# Patient Record
Sex: Female | Born: 1950 | Race: White | Hispanic: No | Marital: Married | Smoking: Never smoker
Health system: Southern US, Community
[De-identification: ages and names within clinical notes are randomized; demographics above are authoritative.]

## PROBLEM LIST (undated history)

## (undated) DIAGNOSIS — E785 Hyperlipidemia, unspecified: Secondary | ICD-10-CM

---

## 2019-03-31 ENCOUNTER — Ambulatory Visit (INDEPENDENT_AMBULATORY_CARE_PROVIDER_SITE_OTHER): Payer: Medicare Other

## 2019-03-31 ENCOUNTER — Other Ambulatory Visit: Payer: Self-pay

## 2019-03-31 ENCOUNTER — Encounter (HOSPITAL_COMMUNITY): Payer: Self-pay

## 2019-03-31 ENCOUNTER — Ambulatory Visit (HOSPITAL_COMMUNITY)
Admission: EM | Admit: 2019-03-31 | Discharge: 2019-03-31 | Disposition: A | Discharge status: Home / Self Care | Payer: Medicare Other | Attending: Family Medicine | Admitting: Family Medicine

## 2019-03-31 DIAGNOSIS — M5136 Other intervertebral disc degeneration, lumbar region: Secondary | ICD-10-CM

## 2019-03-31 DIAGNOSIS — M545 Low back pain, unspecified: Secondary | ICD-10-CM

## 2019-03-31 HISTORY — DX: Hyperlipidemia, unspecified: E78.5

## 2019-03-31 MED ORDER — METHYLPREDNISOLONE 4 MG PO TBPK: ORAL_TABLET | ORAL | 0 refills | Status: AC

## 2019-03-31 MED ORDER — HYDROCODONE-ACETAMINOPHEN 5-325 MG PO TABS: 1.0000 | ORAL_TABLET | Freq: Four times a day (QID) | ORAL | 0 refills | Status: DC | PRN

## 2019-03-31 MED ORDER — METHYLPREDNISOLONE 4 MG PO TBPK: ORAL_TABLET | ORAL | 0 refills | Status: DC

## 2019-03-31 MED ORDER — HYDROCODONE-ACETAMINOPHEN 5-325 MG PO TABS: 1.0000 | ORAL_TABLET | Freq: Four times a day (QID) | ORAL | 0 refills | Status: AC | PRN

## 2019-03-31 NOTE — Discharge Instructions (Addendum)
Activity as tolerated Ice or heat to back Take the Medrol pack (steroid) as directed.  Take all of day 1 today Take pain medicine as needed Return if worse at any time instead of better, or if you fail to improve over the next few days

## 2019-03-31 NOTE — ED Triage Notes (Signed)
Pt presents to UC w/ c/o injury to back by lifting grandchildren. Pt has c/o mid lower back pain for 2-3 days. Pt has difficulty getting up from chair.  Pt states pain is worse when she sits.

## 2019-03-31 NOTE — ED Provider Notes (Signed)
[facility]    CSN: [id number] Arrival date & time: 03/31/19  0805      History   Chief Complaint Chief Complaint  Patient presents with  . Back Pain    HPI Anna Delgado is a 68 y.o. female.   HPI  Patient states that she has low back pain.  In the central low back.  Its a little bit more towards the left side.  No injury or fall.  No known back condition.  No known osteoporosis or bone disease.  Patient states that it is been present for 2 to 3 days.  Getting worse.  It hurts when she sits and when she moves.  She has tried Advil with no improvement.  No radiation of pain to the legs.  No numbness or weakness. No urinary symptoms or recent fever.  No history of cancer Patient states that her daughter is a Publishing rights manager. she is here from [place] visiting her daughter and family.  She has been doing more bending and lifting of small grandchildren.  Her daughter has suggested that she needs an x-ray  Past Medical History:  Diagnosis Date  . Hyperlipidemia     There are no active problems to display for this patient.   History reviewed. No pertinent surgical history.  OB History   No obstetric history on file.      Home Medications    Prior to Admission medications   Medication Sig Start Date End Date Taking? Authorizing Provider  rosuvastatin (CRESTOR) 10 MG tablet Take 10 mg by mouth daily.   Yes [provider]  HYDROcodone-acetaminophen (NORCO/VICODIN) 5-325 MG tablet Take 1-2 tablets by mouth every 6 (six) hours as needed. 03/31/19   Eustace Moore, MD  methylPREDNISolone (MEDROL DOSEPAK) 4 MG TBPK tablet tad 03/31/19   Eustace Moore, MD    Family History Family History  Problem Relation Age of Onset  . Healthy Mother   . Healthy Father     Social History Social History   Tobacco Use  . Smoking status: Never Smoker  . Smokeless tobacco: Never Used  Substance Use Topics  . Alcohol use: Not on file  . Drug use: Not  on file     Allergies   Patient has no known allergies.   Review of Systems Review of Systems  Constitutional: Negative for chills and fever.  HENT: Negative for ear pain and sore throat.   Eyes: Negative for pain and visual disturbance.  Respiratory: Negative for cough and shortness of breath.   Cardiovascular: Negative for chest pain and palpitations.  Gastrointestinal: Negative for abdominal pain and vomiting.  Genitourinary: Negative for dysuria and hematuria.  Musculoskeletal: Positive for back pain. Negative for arthralgias.  Skin: Negative for color change and rash.  Neurological: Negative for seizures and syncope.  All other systems reviewed and are negative.    Physical Exam Triage Vital Signs ED Triage Vitals  Enc Vitals Group     BP 03/31/19 0827 (!) 156/89     Pulse Rate 03/31/19 0827 76     Resp 03/31/19 0827 16     Temp 03/31/19 0827 98.4 F (36.9 C)     Temp Source 03/31/19 0827 Oral     SpO2 03/31/19 0827 96 %     Weight --      Height --      Head Circumference --      Peak Flow --      Pain Score 03/31/19 0831 5  Pain Loc --      Pain Edu? --      Excl. in GC? --    No data found.  Updated Vital Signs BP (!) 156/89 (BP Location: Left Arm)   Pulse 76   Temp 98.4 F (36.9 C) (Oral)   Resp 16   SpO2 96%      Physical Exam Constitutional:      General: She is not in acute distress.    Appearance: She is well-developed and normal weight.     Comments: Appears uncomfortable.  Guarded movements  HENT:     Head: Normocephalic and atraumatic.     Mouth/Throat:     Comments: Mask in place Eyes:     Conjunctiva/sclera: Conjunctivae normal.     Pupils: Pupils are equal, round, and reactive to light.  Neck:     Musculoskeletal: Normal range of motion.  Cardiovascular:     Rate and Rhythm: Normal rate.  Pulmonary:     Effort: Pulmonary effort is normal. No respiratory distress.  Abdominal:     General: There is no distension.      Palpations: Abdomen is soft.  Musculoskeletal: Normal range of motion.     Comments: Back is straight and symmetric.  No tenderness.  No palpable muscle spasm.  Patient can forward flex to fingertips just above the floor.  Normal extension and lateral movement.  Strength sensation range of motion reflexes normal in both lower extremities.  Straight leg raise causes increased back pain with no radiculopathy.  Skin:    General: Skin is warm and dry.  Neurological:     General: No focal deficit present.     Mental Status: She is alert.     Coordination: Coordination normal.     Deep Tendon Reflexes: Reflexes normal.  Psychiatric:        Mood and Affect: Mood normal.        Behavior: Behavior normal.    Explained patient with nontraumatic back pain, x-ray is rarely indicated.  Her daughter, who is a medical provider, has convinced patient that she may have a bony injury.  X-rays performed.  UC Treatments / Results  Labs (all labs ordered are listed, but only abnormal results are displayed) Labs Reviewed - No data to display  EKG   Radiology Dg Lumbar Spine Complete  Result Date: 03/31/2019 CLINICAL DATA:  Low back pain for the past 3 days, potentially from lifting injury. EXAM: LUMBAR SPINE - COMPLETE 4+ VIEW COMPARISON:  None. FINDINGS: There are 6 non rib-bearing lumbar type vertebral bodies. For the purposes of this dictation, lumbar levels be labeled L1 through L6. There is a mild scoliotic curvature of the thoracolumbar spine with dominant caudal component convex the left measuring approximately 16 degrees (as measured from the superior endplate of L3 to the inferior endplate of L5). No anterolisthesis or retrolisthesis. No definite pars defects. Lumbar vertebral body heights are preserved. Lumbar intervertebral disc space heights are preserved. Mild to moderate multilevel lumbar spine DDD, worse at L4-L5, and to a lesser extent, L3-L4, L5-L6 and L6-S1 with disc space height loss,  endplate irregularity and sclerosis. Limited visualization of the bilateral SI joints is normal. Phleboliths overlie the lower pelvis bilaterally. Large colonic stool burden without evidence of enteric obstruction. IMPRESSION: 1. Transitional anatomy with spinal labeling as above. 2. Mild-to-moderate multilevel lumbar spine DDD worse at L4-L5. Electronically Signed   By: Simonne ComeJohn  Watts M.D.   On: 03/31/2019 09:34    Procedures Procedures (including critical  care time)  Medications Ordered in UC Medications - No data to display  Initial Impression / Assessment and Plan / UC Course  I have reviewed the triage vital signs and the nursing notes.  Pertinent labs & imaging results that were available during my care of the patient were reviewed by me and considered in my medical decision making (see chart for details).     Patient has pre-existing lumbar degenerative disc disease is most pronounced at the L4-5 level.  This corresponds with the pain.  No evidence of disc herniation or radiculopathy.  Will treat with steroids and pain management.  Activity as tolerated.  Return for any worsening pain or failure to improve Final Clinical Impressions(s) / UC Diagnoses   Final diagnoses:  Acute midline low back pain without sciatica  DDD (degenerative disc disease), lumbar     Discharge Instructions     Activity as tolerated Ice or heat to back Take the Medrol pack (steroid) as directed.  Take all of day 1 today Take pain medicine as needed Return if worse at any time instead of better, or if you fail to improve over the next few days    ED Prescriptions    Medication Sig Dispense Auth. Provider   methylPREDNISolone (MEDROL DOSEPAK) 4 MG TBPK tablet  (Status: Discontinued) tad 21 tablet Raylene Everts, MD   HYDROcodone-acetaminophen (NORCO/VICODIN) 5-325 MG tablet  (Status: Discontinued) Take 1-2 tablets by mouth every 6 (six) hours as needed. 10 tablet Raylene Everts, MD    HYDROcodone-acetaminophen (NORCO/VICODIN) 5-325 MG tablet Take 1-2 tablets by mouth every 6 (six) hours as needed. 10 tablet Raylene Everts, MD   methylPREDNISolone (MEDROL DOSEPAK) 4 MG TBPK tablet tad 21 tablet Raylene Everts, MD     I have reviewed the PDMP during this encounter.   Raylene Everts, MD 03/31/19 [phone]

## 2019-11-25 ENCOUNTER — Telehealth: Payer: Medicare Other | Admitting: Nurse Practitioner

## 2019-11-25 DIAGNOSIS — J0100 Acute maxillary sinusitis, unspecified: Secondary | ICD-10-CM

## 2019-11-25 MED ORDER — AMOXICILLIN-POT CLAVULANATE 875-125 MG PO TABS: 1.0000 | ORAL_TABLET | Freq: Two times a day (BID) | ORAL | 0 refills | Status: AC

## 2019-11-25 NOTE — Progress Notes (Signed)

## 2020-03-25 ENCOUNTER — Encounter (HOSPITAL_COMMUNITY): Payer: Self-pay | Admitting: *Deleted

## 2020-03-25 ENCOUNTER — Other Ambulatory Visit: Payer: Self-pay

## 2020-03-25 ENCOUNTER — Ambulatory Visit (HOSPITAL_COMMUNITY)
Admission: EM | Admit: 2020-03-25 | Discharge: 2020-03-25 | Disposition: A | Discharge status: Home / Self Care | Payer: Medicare Other | Attending: Emergency Medicine | Admitting: Emergency Medicine

## 2020-03-25 DIAGNOSIS — J029 Acute pharyngitis, unspecified: Secondary | ICD-10-CM | POA: Insufficient documentation

## 2020-03-25 NOTE — ED Triage Notes (Signed)
Pt reports Sore throat .

## 2020-03-25 NOTE — ED Notes (Signed)
PT left [facility] to go to [facility] to pick up  meds for other family

## 2020-03-28 LAB — CULTURE, GROUP A STREP (THRC)

## 2020-03-30 NOTE — ED Provider Notes (Signed)
[facility]    CSN: [id number] Arrival date & time: 03/25/20  1826      History   Chief Complaint Chief Complaint  Patient presents with  . Sore Throat    HPI Anna Delgado is a 69 y.o. female.   Pt here with husband. Her grand daughter that they have been watching was dx with steph. They would like a test. States she feels like her throat is scratchy. Has not taken anything pta.      Past Medical History:  Diagnosis Date  . Hyperlipidemia     There are no problems to display for this patient.   History reviewed. No pertinent surgical history.  OB History   No obstetric history on file.      Home Medications    Prior to Admission medications   Medication Sig Start Date End Date Taking? Authorizing Provider  amoxicillin-clavulanate (AUGMENTIN) 875-125 MG tablet Take 1 tablet by mouth 2 (two) times daily. 11/25/19   Daphine Deutscher Mary-Margaret, FNP  HYDROcodone-acetaminophen (NORCO/VICODIN) 5-325 MG tablet Take 1-2 tablets by mouth every 6 (six) hours as needed. 03/31/19   Eustace Moore, MD  methylPREDNISolone (MEDROL DOSEPAK) 4 MG TBPK tablet tad 03/31/19   Eustace Moore, MD  rosuvastatin (CRESTOR) 10 MG tablet Take 10 mg by mouth daily.    [provider]    Family History Family History  Problem Relation Age of Onset  . Healthy Mother   . Healthy Father     Social History Social History   Tobacco Use  . Smoking status: Never Smoker  . Smokeless tobacco: Never Used  Substance Use Topics  . Alcohol use: Not on file  . Drug use: Not on file     Allergies   Patient has no known allergies.   Review of Systems Review of Systems  Constitutional: Negative.   HENT: Positive for sore throat.   Respiratory: Negative.   Gastrointestinal: Negative.   Genitourinary: Negative.   Skin: Negative.   Neurological: Negative.      Physical Exam Triage Vital Signs ED Triage Vitals  Enc Vitals Group     BP 03/25/20 1946 (!)  157/92     Pulse Rate 03/25/20 1946 83     Resp 03/25/20 1946 18     Temp 03/25/20 1946 98.5 F (36.9 C)     Temp Source 03/25/20 1946 Oral     SpO2 03/25/20 1946 94 %     Weight 03/25/20 1912 180 lb (81.6 kg)     Height 03/25/20 1912 5\' 11"  (1.803 m)     Head Circumference --      Peak Flow --      Pain Score 03/25/20 1949 1     Pain Loc --      Pain Edu? --      Excl. in GC? --    No data found.  Updated Vital Signs BP (!) 157/92 (BP Location: Right Arm)   Pulse 83   Temp 98.5 F (36.9 C) (Oral)   Resp 18   Ht 5\' 9"  (1.753 m)   Wt 180 lb (81.6 kg)   SpO2 94%   BMI 26.58 kg/m   Visual Acuity      Physical Exam   UC Treatments / Results  Labs (all labs ordered are listed, but only abnormal results are displayed) Labs Reviewed  CULTURE, GROUP A STREP [facility])    EKG   Radiology No results found.  Procedures Procedures (including critical care time)  Medications Ordered in UC Medications - No data to display  Initial Impression / Assessment and Plan / UC Course  I have reviewed the triage vital signs and the nursing notes.  Pertinent labs & imaging results that were available during my care of the patient were reviewed by me and considered in my medical decision making (see chart for details).    Pt will need to look in my chart for results or test Take motrin as needed  Final Clinical Impressions(s) / UC Diagnoses   Final diagnoses:  Pharyngitis, unspecified etiology   Discharge Instructions   None    ED Prescriptions    None     PDMP not reviewed this encounter.   Coralyn Mark, NP 03/30/20 1142

## 2021-04-16 IMAGING — DX DG LUMBAR SPINE COMPLETE 4+V
5 series · 5 of 5 positions shown · non-contrast
Comparison: None.

CLINICAL DATA: Low back pain for the past 3 days, potentially from
lifting injury.

EXAM:
LUMBAR SPINE - COMPLETE 4+ VIEW

[l-spine ap]
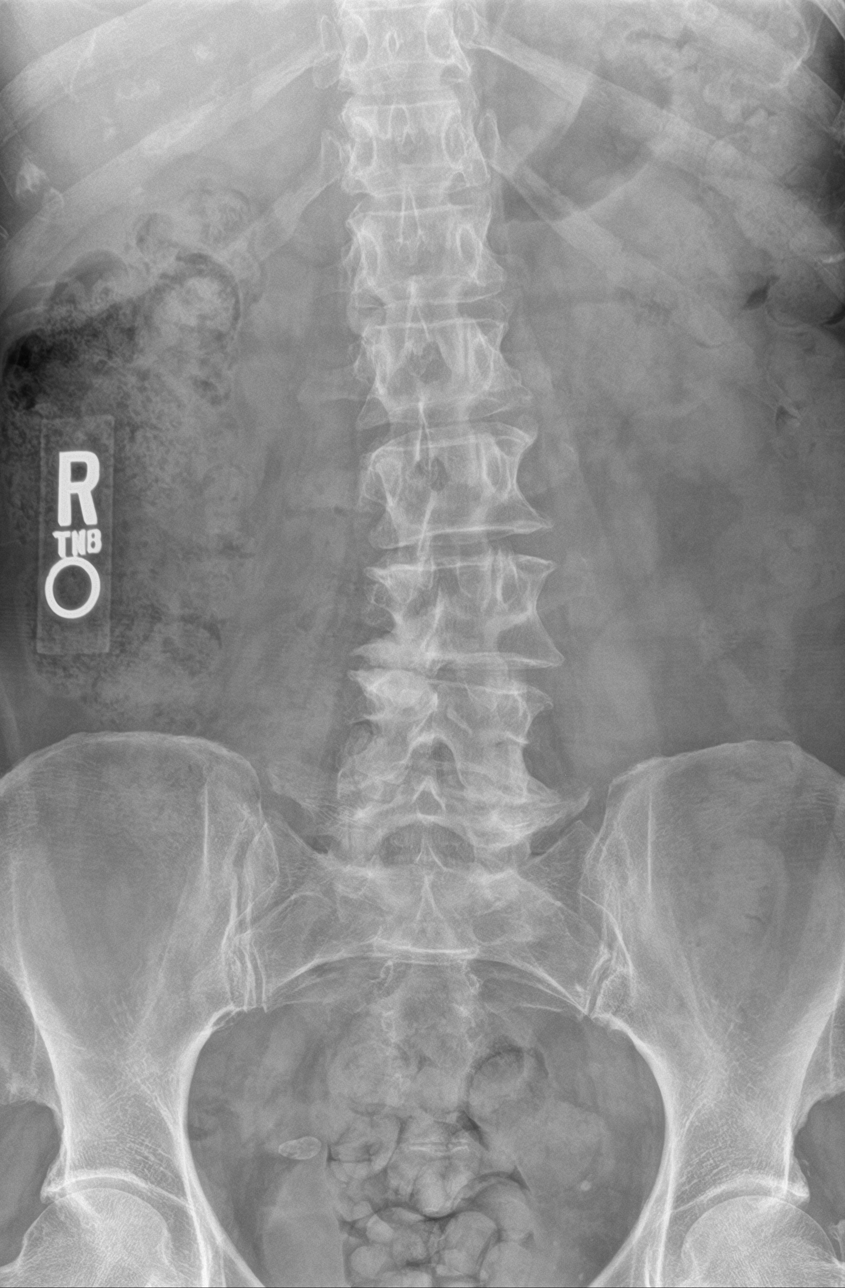

[l-spine obl (1 of 2)]
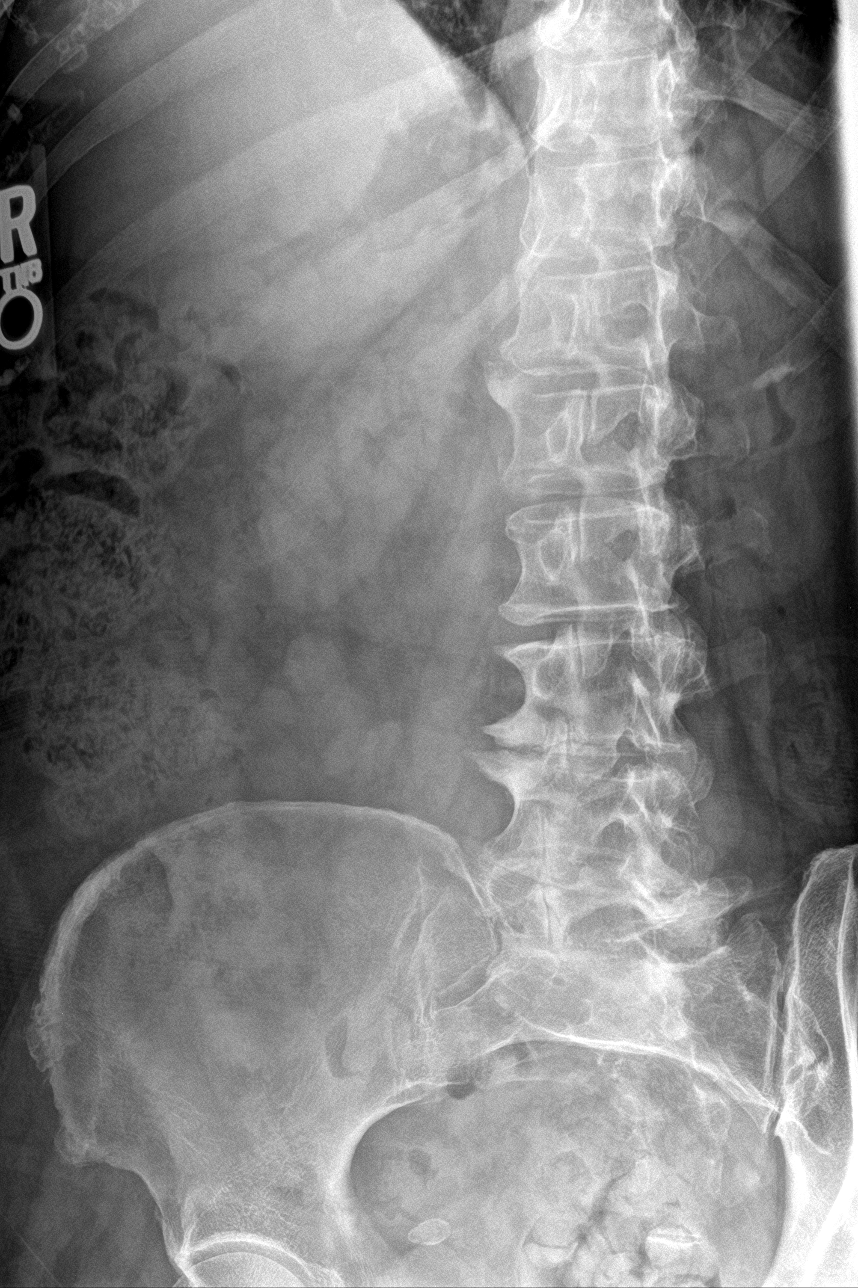

[l-spine obl (2 of 2)]
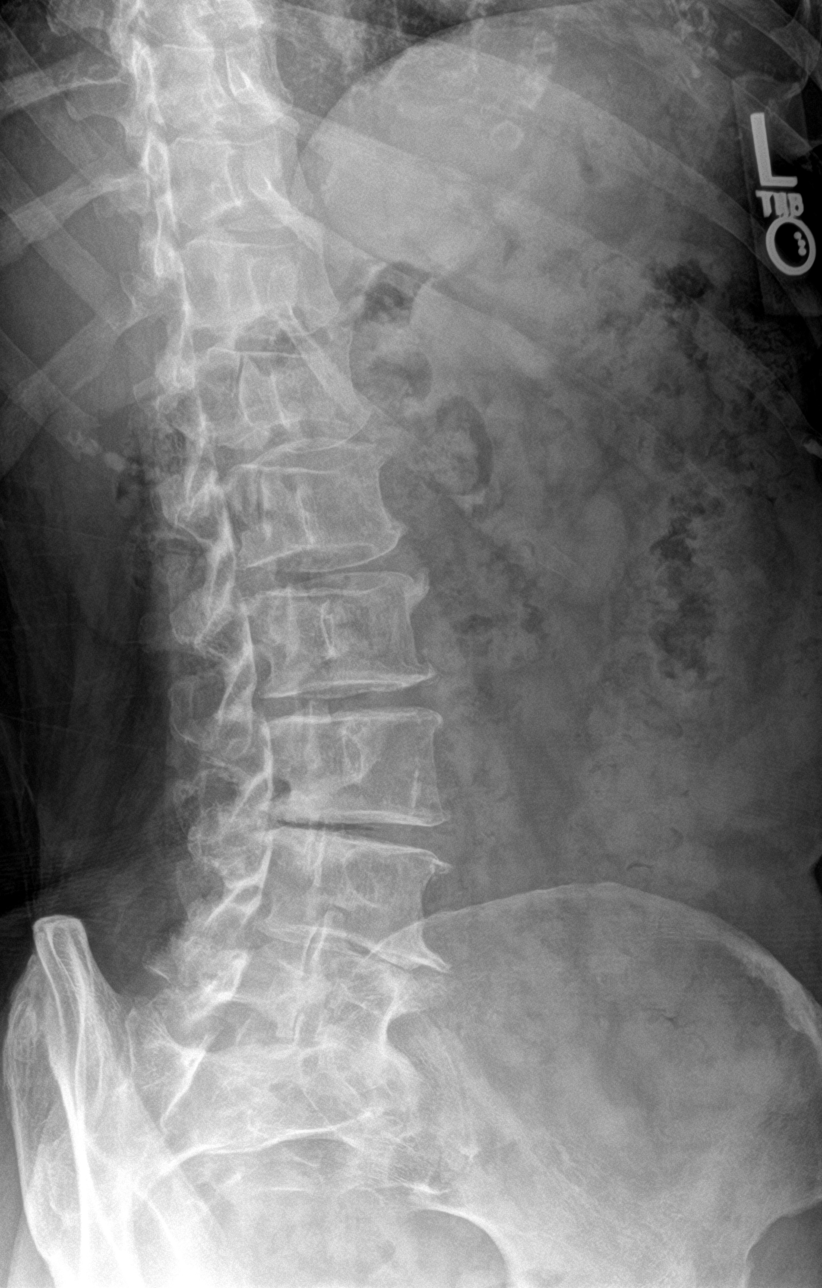

[l-spine lat]
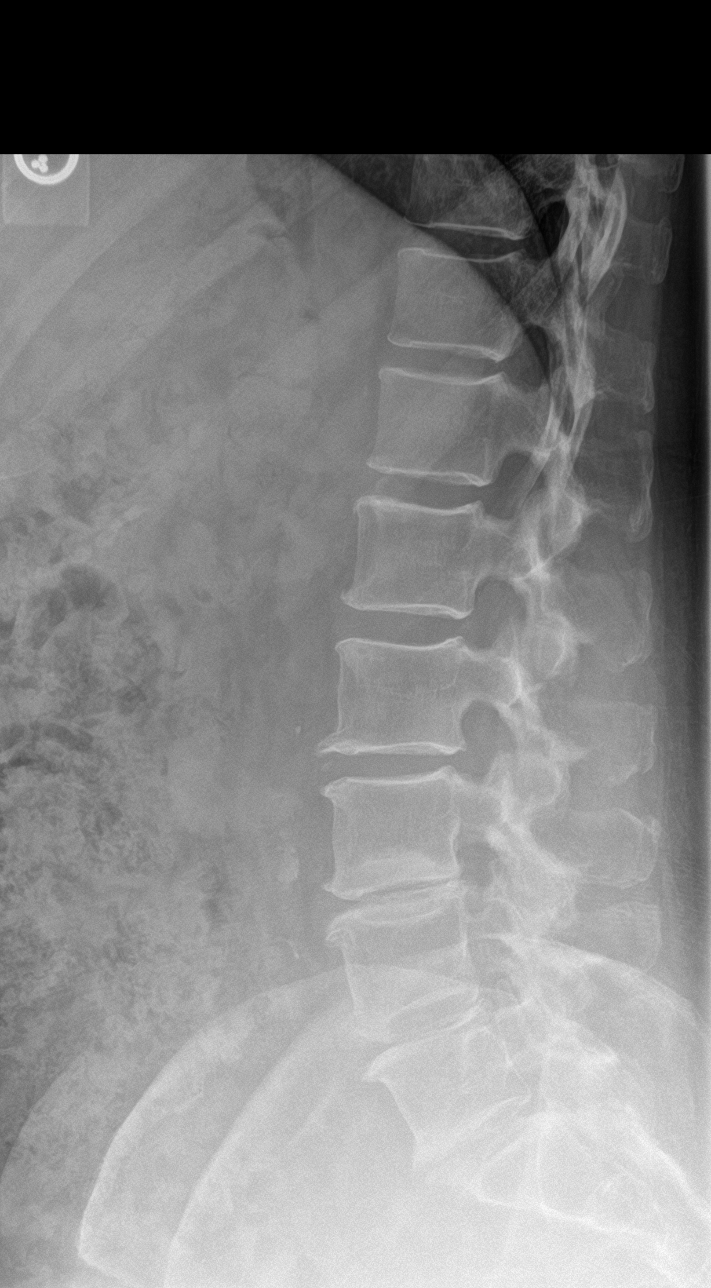

[l-spine spot]
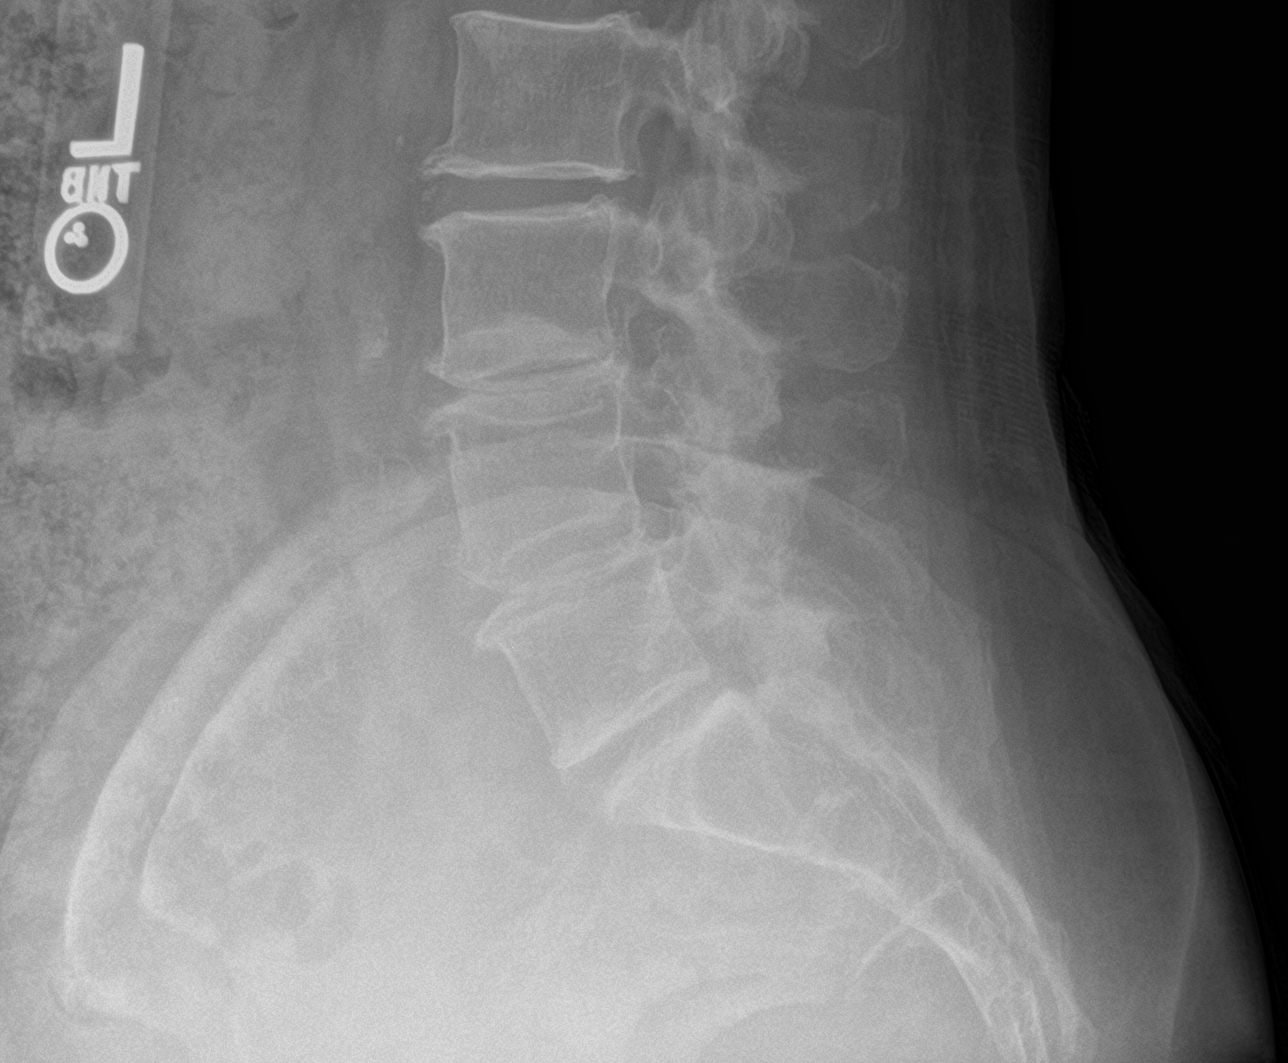

[5 of 5 positions shown; findings below may reference images not displayed]

FINDINGS: There are 6 non rib-bearing lumbar type vertebral bodies. For the
purposes of this dictation, lumbar levels be labeled L1 through L6.

There is a mild scoliotic curvature of the thoracolumbar spine with
dominant caudal component convex the left measuring approximately 16
degrees (as measured from the superior endplate of L3 to the
inferior endplate of L5). No anterolisthesis or retrolisthesis. No
definite pars defects.

Lumbar vertebral body heights are preserved.

Lumbar intervertebral disc space heights are preserved.

Mild to moderate multilevel lumbar spine DDD, worse at L4-L5, and to
a lesser extent, L3-L4, L5-L6 and L6-S1 with disc space height loss,
endplate irregularity and sclerosis.

Limited visualization of the bilateral SI joints is normal.

Phleboliths overlie the lower pelvis bilaterally. Large colonic
stool burden without evidence of enteric obstruction.
IMPRESSION: 1. Transitional anatomy with spinal labeling as above.
2. Mild-to-moderate multilevel lumbar spine DDD worse at L4-L5.
